# Patient Record
Sex: Female | Born: 1990 | Race: White | Hispanic: No | Marital: Single
Health system: Southern US, Community
[De-identification: ages and names within clinical notes are randomized; demographics above are authoritative.]

---
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
## 2011-04-30 ENCOUNTER — Ambulatory Visit: Payer: Self-pay | Admitting: Obstetrics & Gynecology

## 2011-04-30 LAB — CBC
MCV: 88 fL (ref 80–100)
Platelet: 217 10*3/uL (ref 150–440)
RBC: 4.39 10*6/uL (ref 3.80–5.20)
RDW: 12.9 % (ref 11.5–14.5)
WBC: 6.6 10*3/uL (ref 3.6–11.0)

## 2011-04-30 LAB — PREGNANCY, URINE: Pregnancy Test, Urine: NEGATIVE m[IU]/mL

## 2011-05-03 ENCOUNTER — Ambulatory Visit: Payer: Self-pay | Admitting: Obstetrics & Gynecology

## 2014-06-20 NOTE — Op Note (Signed)
PATIENT NAME:  Melanie HaberWAYLAND, [name] MR#:  [id number] DATE OF BIRTH:  [birth year]  DATE OF PROCEDURE:  05/03/2011  PREOPERATIVE DIAGNOSIS:  1. Pelvic pain.  2. Desire for contraception.   POSTOPERATIVE DIAGNOSIS:  1. Pelvic pain. 2. Desire for contraception.   PROCEDURE PERFORMED:  1. Diagnostic laparoscopy.  2. Placement of Mirena intrauterine device.   SURGEON: Dierdre Searles. Paul Taylen Wendland, MD   ANESTHESIA: General.   ESTIMATED BLOOD LOSS: Minimal.   COMPLICATIONS: None.   FINDINGS: There was no evidence for endometriosis, adhesions, salpingitis, hydrosalpinx, ovarian cyst or other ovarian disease, fibroids, appendicitis, pelvic congestion syndrome, or any other etiology for pelvic pain.   DISPOSITION: To the recovery room in stable condition.   TECHNIQUE: The patient is prepped and draped in the usual sterile fashion after adequate anesthesia is obtained in the dorsal lithotomy position. The bladder is drained with a Robinson catheter. Attention is then turned to the abdomen where a Veress needle is inserted through a 5 mm infraumbilical incision after Marcaine is used to anesthetize the skin. Veress needle placement is confirmed using the hanging drop technique, and the abdomen is then insufflated with CO2 gas. A 5 mm trocar is then inserted under direct visualization with the laparoscope with no injuries or bleeding noted. The above-mentioned findings are visualized. As there is no further need for surgical procedures, the gas is expelled. The trocars are removed, and the skin is closed with Dermabond.   Attention is then turned to the vaginal exam. The cervix is grasped with a tenaculum and gently dilated with the uterine sound, which also measures it to be 7 cm. The Mirena intrauterine device is then placed without complication and deploys securely and safely in the right distance. It is given time for the arms to expand and then is tapped into place, and the placement catheter is removed. The  strings are cut 3 cm from the cervical os. The tenaculum is removed with no bleeding noted. The patient goes to the recovery room in stable condition tolerating this procedure well. All sponge, instrument, and needle counts are correct.   ____________________________ R. Annamarie MajorPaul Ruthia Person, MD rph:cbb D: 05/03/2011 17:02:40 ET T: 05/03/2011 17:40:15 ET JOB#: [id number]  cc: Dierdre Searles. Paul Remell Giaimo, MD, <Dictator> Nadara MustardOBERT P Trong Gosling MD ELECTRONICALLY SIGNED 05/04/2011 9:11
